# Patient Record
Sex: Female | Born: 1975 | Hispanic: Yes | Marital: Single | Smoking: Current every day smoker
Health system: Southern US, Community
[De-identification: ages and names within clinical notes are randomized; demographics above are authoritative.]

## PROBLEM LIST (undated history)

## (undated) DIAGNOSIS — I10 Essential (primary) hypertension: Secondary | ICD-10-CM

## (undated) HISTORY — PX: HERNIA REPAIR: SHX51

## (undated) HISTORY — PX: OTHER SURGICAL HISTORY: SHX169

## (undated) HISTORY — PX: CHOLECYSTECTOMY: SHX55

## (undated) HISTORY — PX: TUBAL LIGATION: SHX77

---

## 2014-10-09 ENCOUNTER — Emergency Department (HOSPITAL_COMMUNITY): Payer: Self-pay

## 2014-10-09 ENCOUNTER — Encounter (HOSPITAL_COMMUNITY): Payer: Self-pay | Admitting: Emergency Medicine

## 2014-10-09 ENCOUNTER — Emergency Department (HOSPITAL_COMMUNITY)
Admission: EM | Admit: 2014-10-09 | Discharge: 2014-10-09 | Disposition: A | Discharge status: Home / Self Care | Payer: Self-pay | Attending: Emergency Medicine | Admitting: Emergency Medicine

## 2014-10-09 DIAGNOSIS — S42292A Other displaced fracture of upper end of left humerus, initial encounter for closed fracture: Secondary | ICD-10-CM

## 2014-10-09 DIAGNOSIS — Z720 Tobacco use: Secondary | ICD-10-CM | POA: Insufficient documentation

## 2014-10-09 DIAGNOSIS — W19XXXA Unspecified fall, initial encounter: Secondary | ICD-10-CM

## 2014-10-09 DIAGNOSIS — I10 Essential (primary) hypertension: Secondary | ICD-10-CM | POA: Insufficient documentation

## 2014-10-09 DIAGNOSIS — S42255D Nondisplaced fracture of greater tuberosity of left humerus, subsequent encounter for fracture with routine healing: Secondary | ICD-10-CM | POA: Insufficient documentation

## 2014-10-09 DIAGNOSIS — W1839XD Other fall on same level, subsequent encounter: Secondary | ICD-10-CM | POA: Insufficient documentation

## 2014-10-09 HISTORY — DX: Essential (primary) hypertension: I10

## 2014-10-09 MED ORDER — HYDROCODONE-ACETAMINOPHEN 5-325 MG PO TABS
2.0000 | ORAL_TABLET | Freq: Once | ORAL | Status: AC
  Administered 2014-10-09: 2 via ORAL
  Filled 2014-10-09: qty 2

## 2014-10-09 MED ORDER — DICLOFENAC SODIUM 75 MG PO TBEC: 75.0000 mg | DELAYED_RELEASE_TABLET | Freq: Two times a day (BID) | ORAL | Status: AC

## 2014-10-09 MED ORDER — HYDROCODONE-ACETAMINOPHEN 5-325 MG PO TABS: 1.0000 | ORAL_TABLET | ORAL | Status: DC | PRN

## 2014-10-09 MED ORDER — METHOCARBAMOL 500 MG PO TABS
1000.0000 mg | ORAL_TABLET | Freq: Once | ORAL | Status: AC
  Administered 2014-10-09: 1000 mg via ORAL
  Filled 2014-10-09: qty 2

## 2014-10-09 MED ORDER — KETOROLAC TROMETHAMINE 10 MG PO TABS
10.0000 mg | ORAL_TABLET | Freq: Once | ORAL | Status: AC
  Administered 2014-10-09: 10 mg via ORAL
  Filled 2014-10-09: qty 1

## 2014-10-09 NOTE — ED Provider Notes (Signed)
CSN: [id number]     Arrival date & time 10/09/14  1302 History   None    Chief Complaint  Patient presents with  . Shoulder Pain     (Consider location/radiation/quality/duration/timing/severity/associated sxs/prior Treatment) Patient is a 38 y.o. female presenting with shoulder injury. The history is provided by the patient.  Shoulder Injury This is a new problem. The current episode started 1 to 4 weeks ago. The problem occurs intermittently. The problem has been rapidly worsening. Associated symptoms include arthralgias. Pertinent negatives include no abdominal pain, chest pain, coughing, fever, neck pain, numbness or weakness. Exacerbated by: Pain with movement of he left shoulder. She has tried heat, ice and NSAIDs for the symptoms. The treatment provided significant relief.    Past Medical History  Diagnosis Date  . Hypertension    Past Surgical History  Procedure Laterality Date  . Right eye    . Cholecystectomy    . Hernia repair    . Tubal ligation     No family history on file. History  Substance Use Topics  . Smoking status: Current Every Day Smoker -- 1.00 packs/day    Types: Cigarettes  . Smokeless tobacco: Not on file  . Alcohol Use: No   OB History    No data available     Review of Systems  Constitutional: Negative for fever and activity change.       All ROS Neg except as noted in HPI  HENT: Negative for nosebleeds.   Eyes: Negative for photophobia and discharge.  Respiratory: Negative for cough, shortness of breath and wheezing.   Cardiovascular: Negative for chest pain and palpitations.  Gastrointestinal: Negative for abdominal pain and blood in stool.  Genitourinary: Negative for dysuria, frequency and hematuria.  Musculoskeletal: Positive for arthralgias. Negative for back pain and neck pain.  Skin: Negative.   Neurological: Negative for dizziness, seizures, speech difficulty, weakness and numbness.  Psychiatric/Behavioral: Negative for  hallucinations and confusion.      Allergies  Codeine  Home Medications   Prior to Admission medications   Not on File   BP 161/115 mmHg  Pulse 98  Temp(Src) 97.8 F (36.6 C) (Oral)  Resp 18  Ht 5' (1.524 m)  Wt 142 lb (64.411 kg)  BMI 27.73 kg/m2  SpO2 99%  LMP 10/02/2014 Physical Exam  Constitutional: She is oriented to person, place, and time. She appears well-developed and well-nourished.  Non-toxic appearance.  HENT:  Head: Normocephalic.  Right Ear: Tympanic membrane and external ear normal.  Left Ear: Tympanic membrane and external ear normal.  Eyes: EOM and lids are normal. Pupils are equal, round, and reactive to light.  Neck: Normal range of motion. Neck supple. Carotid bruit is not present.  Cardiovascular: Normal rate, regular rhythm, normal heart sounds, intact distal pulses and normal pulses.   Pulmonary/Chest: Breath sounds normal. No respiratory distress.  Abdominal: Soft. Bowel sounds are normal. There is no tenderness. There is no guarding.  Musculoskeletal:       Left shoulder: She exhibits decreased range of motion, tenderness, swelling and pain.  Lymphadenopathy:       Head (right side): No submandibular adenopathy present.       Head (left side): No submandibular adenopathy present.    She has no cervical adenopathy.  Neurological: She is alert and oriented to person, place, and time. She has normal strength. No cranial nerve deficit or sensory deficit.  Skin: Skin is warm and dry.  Psychiatric: She has a normal mood and affect.  Her speech is normal.  Nursing note and vitals reviewed.   ED Course  Procedures  FRACTURE CARE LEFT SHOULDER. Patient identified by arm band. Permission for procedure given by the patient. Fracture of the left humeral head discussed with the patient in terms which he understands. Patient placed in a shoulder immobilizer. Capillary refill remains less than 2 seconds, pulses 2+, and color as well as temperature stable  after immobilizer placed. Prescription for Decadron and Norco given to the patient. Patient tolerated the procedure without problem.   Labs Review Labs Reviewed - No data to display  Imaging Review Dg Shoulder Left  10/09/2014   CLINICAL DATA:  Left shoulder pain post fall in the bathtub 2 weeks ago  EXAM: LEFT SHOULDER - 2+ VIEW  COMPARISON:  None.  FINDINGS: Three views of the left shoulder submitted. There is nondisplaced healing fracture of greater tuberosity humeral head. Glenohumeral joint is preserved. AC joint is unremarkable.  IMPRESSION: Nondisplaced healing fracture of humeral head greater tuberosity.   Electronically Signed   By: Natasha MeadLiviu  Pop M.D.   On: 10/09/2014 13:57     EKG Interpretation None      MDM  X-ray of the left shoulder reveals a nondisplaced healing fracture of the humeral head at the greater tuberosity. The compartments are soft. There is no vascular compromise appreciated. The patient is fitted with a shoulder immobilizer. Prescriptionfor Norco, diclofenac, and Robaxin given to the patient. The patient is referred to Dr. Hilda LiasKeeling for orthopedic evaluation and management. Patient acknowledges understanding of the findings of the x-ray and the discharge plan.   Final diagnoses:  Humerus head fracture, left, closed, initial encounter    *I have reviewed nursing notes, vital signs, and all appropriate lab and imaging results for this Patient.  Kathie DikeHobson M Rhoderick Farrel, PA-C 10/09/14 1633  Kathie DikeHobson M [place] Droll, PA-C 10/10/14 2022  Linwood DibblesJon Knapp, MD 10/12/14 [phone]

## 2014-10-09 NOTE — Discharge Instructions (Signed)
Please use the shoulder immobilizer until seen by the orthopedic MD . You have a fracture of the shoulder (humeral head) on the left.  Please use diclofenac 2 times daily. Use Norco for more severe pain if needed. This medication may cause drowsiness, use with caution. Humerus Fracture, Treated with Immobilization The humerus is the large bone in your upper arm. You have a broken (fractured) humerus. These fractures are easily diagnosed with X-rays. TREATMENT  Simple fractures which will heal without disability are treated with simple immobilization. Immobilization means you will wear a cast, splint, or sling. You have a fracture which will do well with immobilization. The fracture will heal well simply by being held in a good position until it is stable enough to begin range of motion exercises. Do not take part in activities which would further injure your arm.  HOME CARE INSTRUCTIONS   Put ice on the injured area.  Put ice in a plastic bag.  Place a towel between your skin and the bag.  Leave the ice on for 15-20 minutes, 03-04 times a day.  If you have a cast:  Do not scratch the skin under the cast using sharp or pointed objects.  Check the skin around the cast every day. You may put lotion on any red or sore areas.  Keep your cast dry and clean.  If you have a splint:  Wear the splint as directed.  Keep your splint dry and clean.  You may loosen the elastic around the splint if your fingers become numb, tingle, or turn cold or blue.  If you have a sling:  Wear the sling as directed.  Do not put pressure on any part of your cast or splint until it is fully hardened.  Your cast or splint can be protected during bathing with a plastic bag. Do not lower the cast or splint into water.  Only take over-the-counter or prescription medicines for pain, discomfort, or fever as directed by your caregiver.  Do range of motion exercises as instructed by your caregiver.  Follow up  as directed by your caregiver. This is very important in order to avoid permanent injury or disability and chronic pain. SEEK IMMEDIATE MEDICAL CARE IF:   Your skin or nails in the injured arm turn blue or gray.  Your arm feels cold or numb.  You develop severe pain in the injured arm.  You are having problems with the medicines you were given. MAKE SURE YOU:   Understand these instructions.  Will watch your condition.  Will get help right away if you are not doing well or get worse. Document Released: 02/15/2001 Document Revised: 02/01/2012 Document Reviewed: 12/24/2010 [facility] Patient Information 2015 [place], [place]. This information is not intended to replace advice given to you by your health care provider. Make sure you discuss any questions you have with your health care provider.

## 2014-10-09 NOTE — ED Notes (Signed)
PT reports left shoulder pain from fall x2 weeks ago with pain on ROM.

## 2014-10-24 ENCOUNTER — Emergency Department (HOSPITAL_COMMUNITY): Payer: Self-pay

## 2014-10-24 ENCOUNTER — Emergency Department (HOSPITAL_COMMUNITY)
Admission: EM | Admit: 2014-10-24 | Discharge: 2014-10-24 | Disposition: A | Discharge status: Home / Self Care | Payer: Self-pay | Attending: Emergency Medicine | Admitting: Emergency Medicine

## 2014-10-24 ENCOUNTER — Encounter (HOSPITAL_COMMUNITY): Payer: Self-pay

## 2014-10-24 DIAGNOSIS — Z720 Tobacco use: Secondary | ICD-10-CM | POA: Insufficient documentation

## 2014-10-24 DIAGNOSIS — Z791 Long term (current) use of non-steroidal anti-inflammatories (NSAID): Secondary | ICD-10-CM | POA: Insufficient documentation

## 2014-10-24 DIAGNOSIS — M79602 Pain in left arm: Secondary | ICD-10-CM | POA: Insufficient documentation

## 2014-10-24 DIAGNOSIS — I10 Essential (primary) hypertension: Secondary | ICD-10-CM | POA: Insufficient documentation

## 2014-10-24 MED ORDER — HYDROCODONE-ACETAMINOPHEN 5-325 MG PO TABS: 1.0000 | ORAL_TABLET | Freq: Four times a day (QID) | ORAL | Status: AC | PRN

## 2014-10-24 NOTE — Discharge Instructions (Signed)
As discussed, it is very important that you obtain assistance with orthopedists to ensure appropriate healing of your previously broken arm. If you are unable to follow-up with our local orthopedist, please use the information [facility]  In addition, you may consider following up with health department, or at this facility during daylight hours to discuss assistance from case management and/or social work.   Cryotherapy Cryotherapy means treatment with cold. Ice or gel packs can be used to reduce both pain and swelling. Ice is the most helpful within the first 24 to 48 hours after an injury or flare-up from overusing a muscle or joint. Sprains, strains, spasms, burning pain, shooting pain, and aches can all be eased with ice. Ice can also be used when recovering from surgery. Ice is effective, has very few side effects, and is safe for most people to use. PRECAUTIONS  Ice is not a safe treatment option for people with:  Raynaud phenomenon. This is a condition affecting small blood vessels in the extremities. Exposure to cold may cause your problems to return.  Cold hypersensitivity. There are many forms of cold hypersensitivity, including:  Cold urticaria. Red, itchy hives appear on the skin when the tissues begin to warm after being iced.  Cold erythema. This is a red, itchy rash caused by exposure to cold.  Cold hemoglobinuria. Red blood cells break down when the tissues begin to warm after being iced. The hemoglobin that carry oxygen are passed into the urine because they cannot combine with blood proteins fast enough.  Numbness or altered sensitivity in the area being iced. If you have any of the following conditions, do not use ice until you have discussed cryotherapy with your caregiver:  Heart conditions, such as arrhythmia, angina, or chronic heart disease.  High blood pressure.  Healing wounds or open skin in the area being iced.  Current  infections.  Rheumatoid arthritis.  Poor circulation.  Diabetes. Ice slows the blood flow in the region it is applied. This is beneficial when trying to stop inflamed tissues from spreading irritating chemicals to surrounding tissues. However, if you expose your skin to cold temperatures for too long or without the proper protection, you can damage your skin or nerves. Watch for signs of skin damage due to cold. HOME CARE INSTRUCTIONS Follow these tips to use ice and cold packs safely.  Place a dry or damp towel between the ice and skin. A damp towel will cool the skin more quickly, so you may need to shorten the time that the ice is used.  For a more rapid response, add gentle compression to the ice.  Ice for no more than 10 to 20 minutes at a time. The bonier the area you are icing, the less time it will take to get the benefits of ice.  Check your skin after 5 minutes to make sure there are no signs of a poor response to cold or skin damage.  Rest 20 minutes or more between uses.  Once your skin is numb, you can end your treatment. You can test numbness by very lightly touching your skin. The touch should be so light that you do not see the skin dimple from the pressure of your fingertip. When using ice, most people will feel these normal sensations in this order: cold, burning, aching, and numbness.  Do not use ice on someone who cannot communicate their responses to pain, such as small children or people with dementia. HOW TO MAKE AN  ICE PACK Ice packs are the most common way to use ice therapy. Other methods include ice massage, ice baths, and cryosprays. Muscle creams that cause a cold, tingly feeling do not offer the same benefits that ice offers and should not be used as a substitute unless recommended by your caregiver. To make an ice pack, do one of the following:  Place crushed ice or a bag of frozen vegetables in a sealable plastic bag. Squeeze out the excess air. Place this  bag inside another plastic bag. Slide the bag into a pillowcase or place a damp towel between your skin and the bag.  Mix 3 parts water with 1 part rubbing alcohol. Freeze the mixture in a sealable plastic bag. When you remove the mixture from the freezer, it will be slushy. Squeeze out the excess air. Place this bag inside another plastic bag. Slide the bag into a pillowcase or place a damp towel between your skin and the bag. SEEK MEDICAL CARE IF:  You develop white spots on your skin. This may give the skin a blotchy (mottled) appearance.  Your skin turns blue or pale.  Your skin becomes waxy or hard.  Your swelling gets worse. MAKE SURE YOU:   Understand these instructions.  Will watch your condition.  Will get help right away if you are not doing well or get worse. Document Released: 07/06/2011 Document Revised: 03/26/2014 Document Reviewed: 07/06/2011 [facility] Patient Information 2015 [place], [place]. This information is not intended to replace advice given to you by your health care provider. Make sure you discuss any questions you have with your health care provider.

## 2014-10-24 NOTE — ED Notes (Signed)
Slipped and fell 1 month ago and had a displaced fracture in my left shoulder. I came here and they placed me in a sling per pt. I do not have insurance, so I have not been able to follow up with an orthopedic.

## 2014-10-24 NOTE — ED Notes (Signed)
EDP at bedside  

## 2014-10-24 NOTE — ED Provider Notes (Signed)
CSN: [id number]     Arrival date & time 10/24/14  1848 History  This chart was scribed for Gerhard Munchobert Clester Chlebowski, MD by Gwenyth Oberatherine Macek, ED Scribe. This patient was seen in room APA08/[facility]08 and the patient's care was started at 7:08 PM.    Chief Complaint  Patient presents with  . Arm Pain   The history is provided by the patient. No language interpreter was used.   HPI Comments: Ann Bennett is a 38 y.o. female with a history of HTN who presents to the Emergency Department complaining of persistent, severe left arm pain that started 1 month ago after she fell and occurs daily. She notes decreased ROM of left shoulder and left elbow because of pain. Pt fell while she was cleaning her shower and was diagnosed with displaced fracture. She notes she cannot follow-up because she does not have insurance and does not know how to seek orthopedist. Pt wears a sling 2-3 hours a day, but states that pain becomes worse when she wears it. Pt was seen in the ED two weeks ago for similar symptoms. She denies numbness and tingling as associated symptoms.  Past Medical History  Diagnosis Date  . Hypertension    Past Surgical History  Procedure Laterality Date  . Right eye    . Cholecystectomy    . Hernia repair    . Tubal ligation     No family history on file. History  Substance Use Topics  . Smoking status: Current Every Day Smoker -- 1.00 packs/day    Types: Cigarettes  . Smokeless tobacco: Not on file  . Alcohol Use: No   OB History    No data available     Review of Systems  Constitutional:       Per HPI, otherwise negative  HENT:       Per HPI, otherwise negative  Respiratory:       Per HPI, otherwise negative  Cardiovascular:       Per HPI, otherwise negative  Gastrointestinal: Negative for vomiting.  Endocrine:       Negative aside from HPI  Genitourinary:       Neg aside from HPI   Musculoskeletal: Positive for arthralgias.       Per HPI, otherwise negative  Skin: Negative.   Negative for color change and wound.  Neurological: Negative for syncope and numbness.    Allergies  Codeine  Home Medications   Prior to Admission medications   Medication Sig Start Date End Date Taking? Authorizing Provider  diclofenac (VOLTAREN) 75 MG EC tablet Take 1 tablet (75 mg total) by mouth 2 (two) times daily. 10/09/14   Kathie DikeHobson M Bryant, PA-C  HYDROcodone-acetaminophen (NORCO/VICODIN) 5-325 MG per tablet Take 1 tablet by mouth every 4 (four) hours as needed. 10/09/14   Kathie DikeHobson M Bryant, PA-C   BP 152/110 mmHg  Pulse 95  Temp(Src) 97.7 F (36.5 C) (Oral)  Resp 20  Ht 5' (1.524 m)  Wt 140 lb (63.504 kg)  BMI 27.34 kg/m2  SpO2 100%  LMP 10/21/2014 (Approximate) Physical Exam  Constitutional: She is oriented to person, place, and time. She appears well-developed and well-nourished. No distress.  HENT:  Head: Normocephalic and atraumatic.  Eyes: Conjunctivae and EOM are normal.  Cardiovascular: Normal rate and regular rhythm.   Pulmonary/Chest: Effort normal and breath sounds normal. No stridor. No respiratory distress.  Abdominal: She exhibits no distension.  Musculoskeletal: She exhibits no edema.  Neurological: She is alert and oriented to person, place, and  time. No cranial nerve deficit.  Left arm neurovascularly intact  Skin: Skin is warm and dry.  Psychiatric: She has a normal mood and affect.  Nursing note and vitals reviewed.   ED Course  Procedures   COORDINATION OF CARE: 7:12 PM Discussed treatment plan with pt which includes shoulder x-ray. Pt agreed to plan.    Labs Review Labs Reviewed - No data to display  Imaging Review Dg Shoulder Left  10/24/2014   CLINICAL DATA:  Continued left shoulder pain following left humeral fracture 1 month ago. Initial encounter.  EXAM: LEFT SHOULDER - 2+ VIEW  COMPARISON:  10/09/2014 radiographs  FINDINGS: A nondisplaced humeral head fracture is again identified with evidence of interval healing since the prior  study.  No acute fracture, subluxation or dislocation noted.  The visualized hemithorax is unremarkable.  IMPRESSION: Interval healing changes of nondisplaced left humeral head fracture without other significant change. No evidence of acute bony abnormality.   Electronically Signed   By: Laveda AbbeJeff  Hu M.D.   On: 10/24/2014 19:59  I reviewed the XR, agree with the interpretation.   MDM   Patient presents several weeks after a fall with ongoing pain in the left shoulder. Patient has not followed up with an orthopedic physician yet. Patient has no distal neurovascular compromise, no other complaints, and although she has shoulder pain, she has no acute pathology that is new. We had a lengthy conversation about obtaining assistance with following up, including seeing the health department, going to primary care, and trying other orthopedist's office this Patient was provided additional resources, analgesics, discharged in stable condition.  I personally performed the services described in this documentation, which was scribed in my presence. The recorded information has been reviewed and is accurate.       Gerhard Munchobert Jermeka Schlotterbeck, MD 10/24/14 2015

## 2014-12-19 IMAGING — CR DG SHOULDER 2+V*L*
3 series · 3 of 3 positions shown · non-contrast
Comparison: 10/09/2014 radiographs

CLINICAL DATA: Continued left shoulder pain following left humeral
fracture 1 month ago. Initial encounter.

EXAM:
LEFT SHOULDER - 2+ VIEW

[view not recorded (1 of 3)]
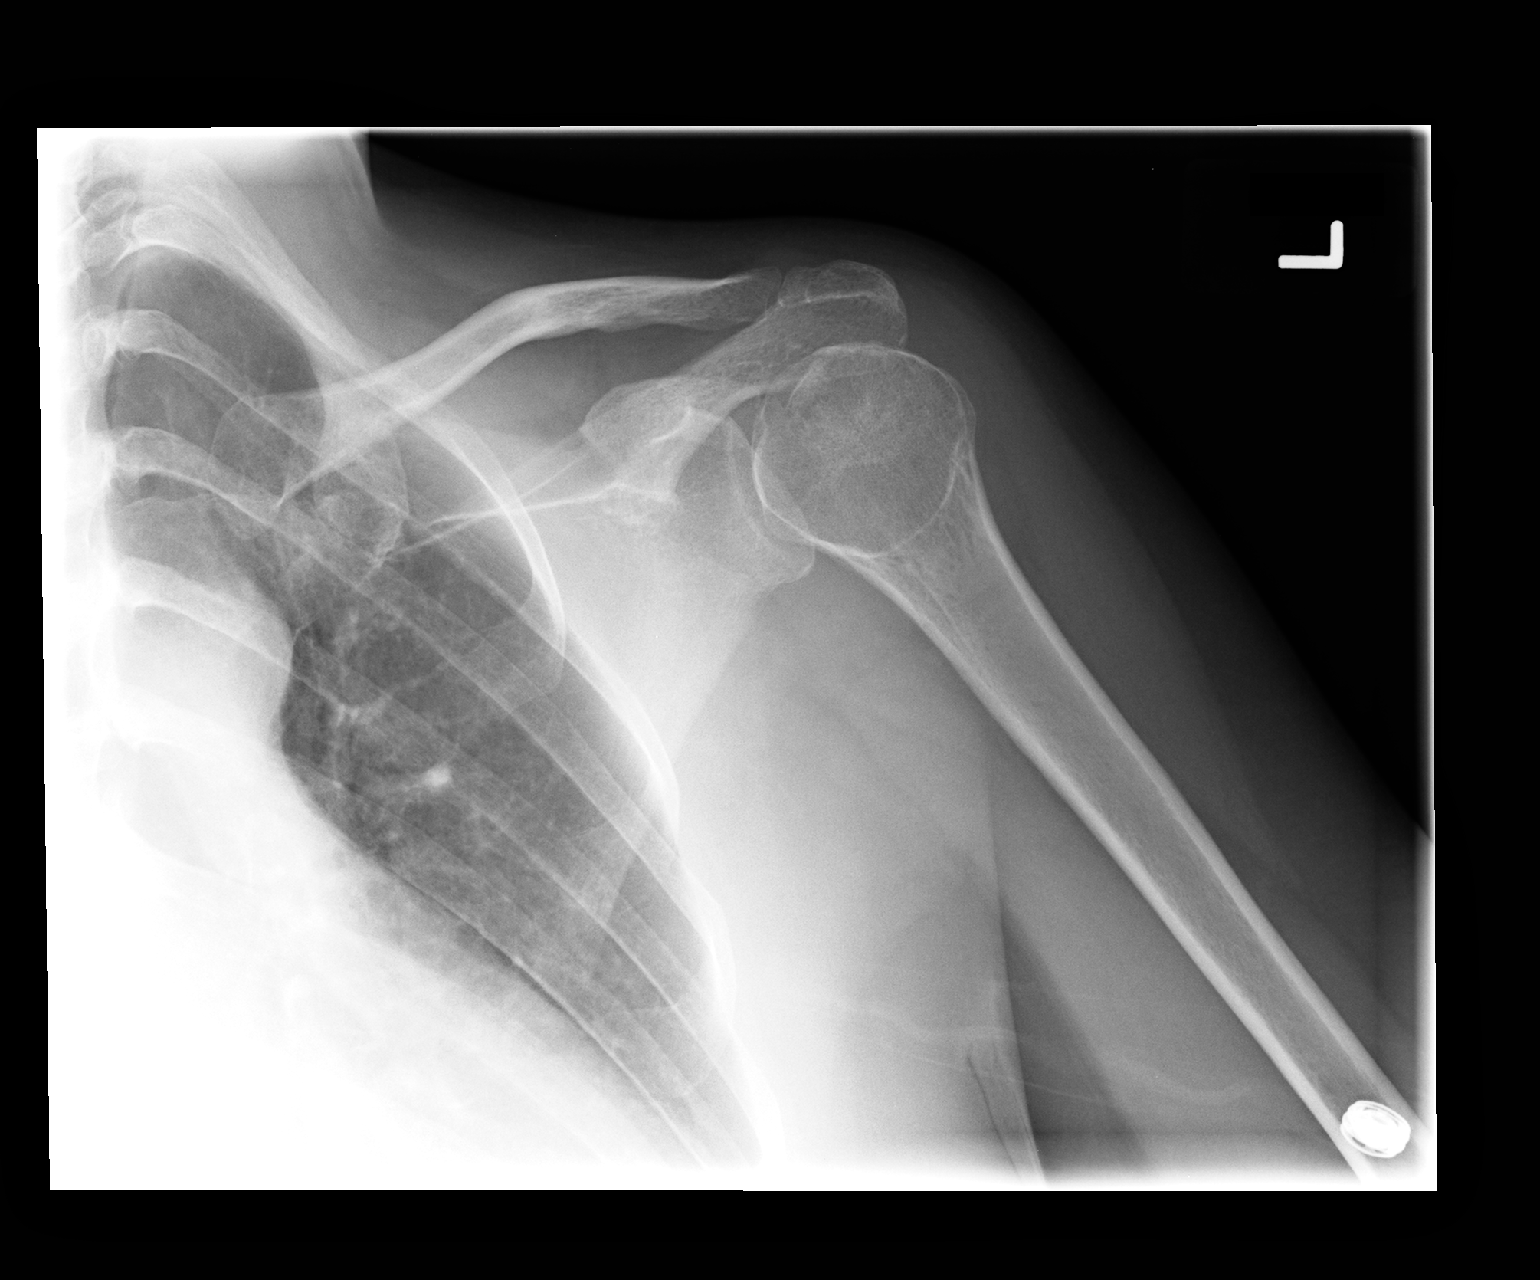

[view not recorded (2 of 3)]
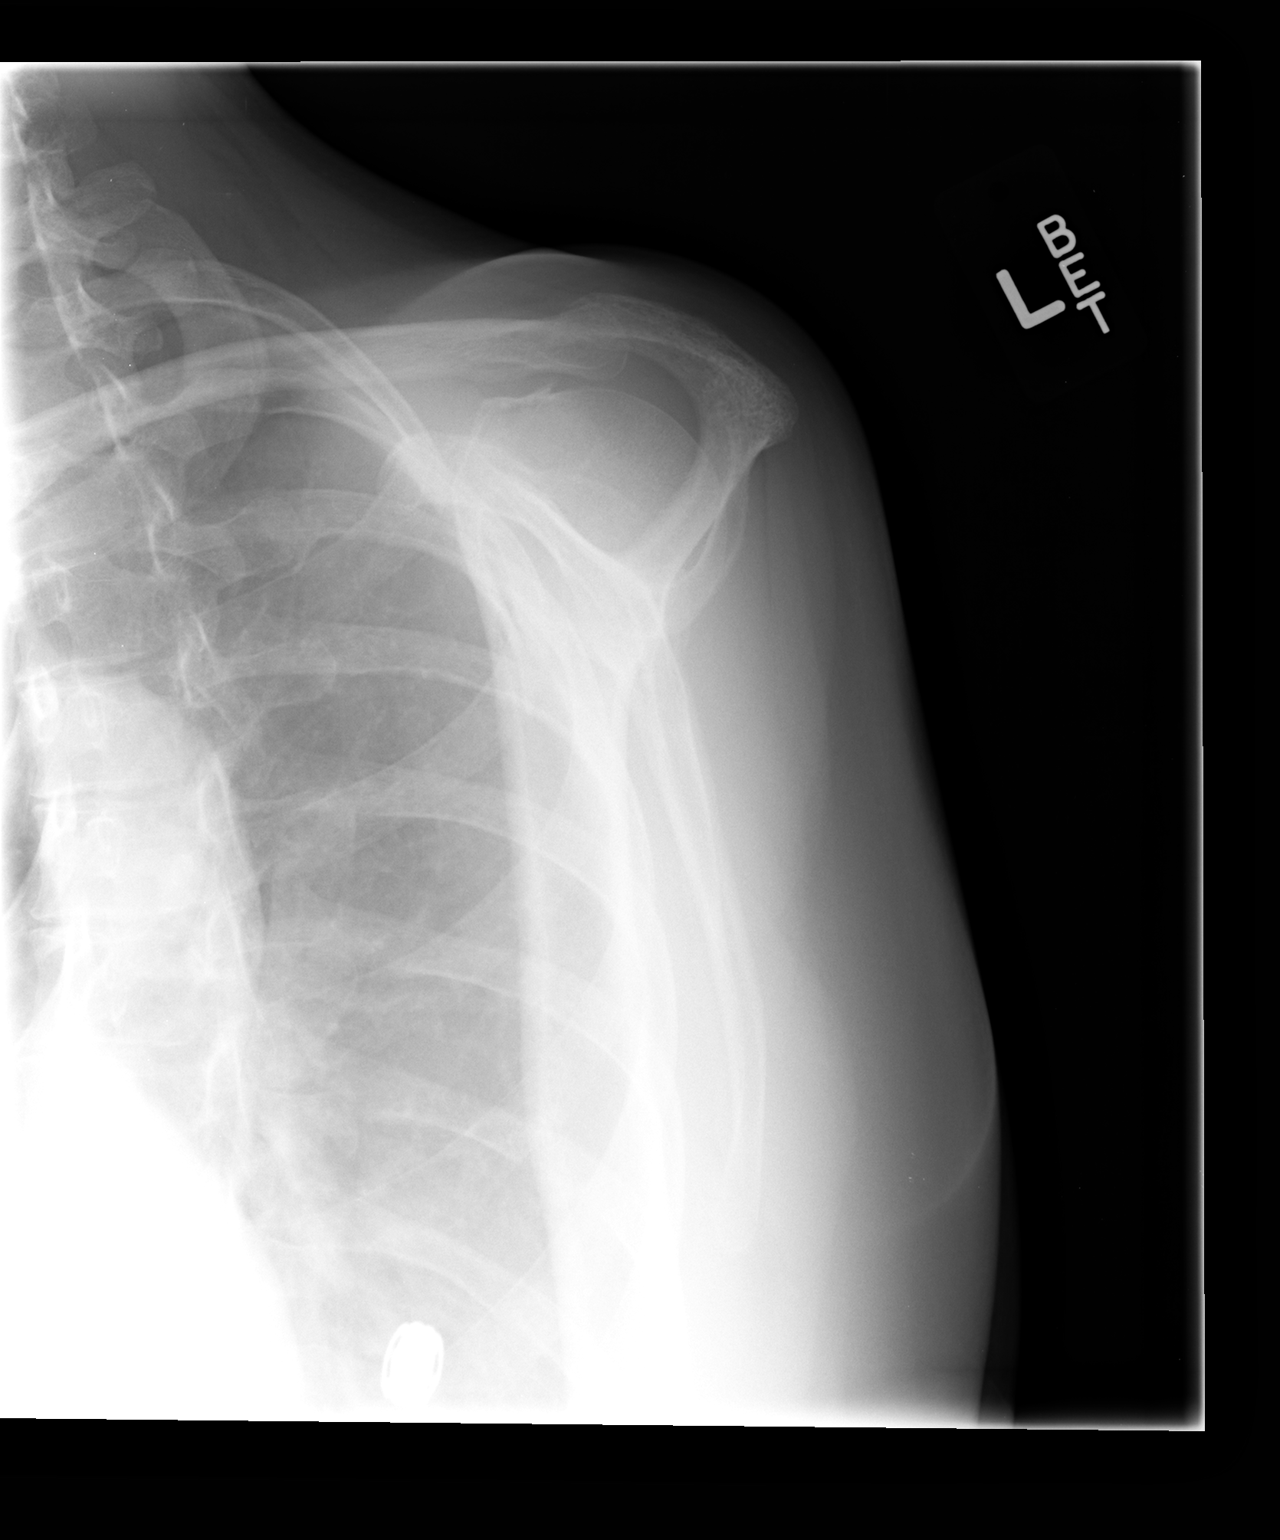

[view not recorded (3 of 3)]
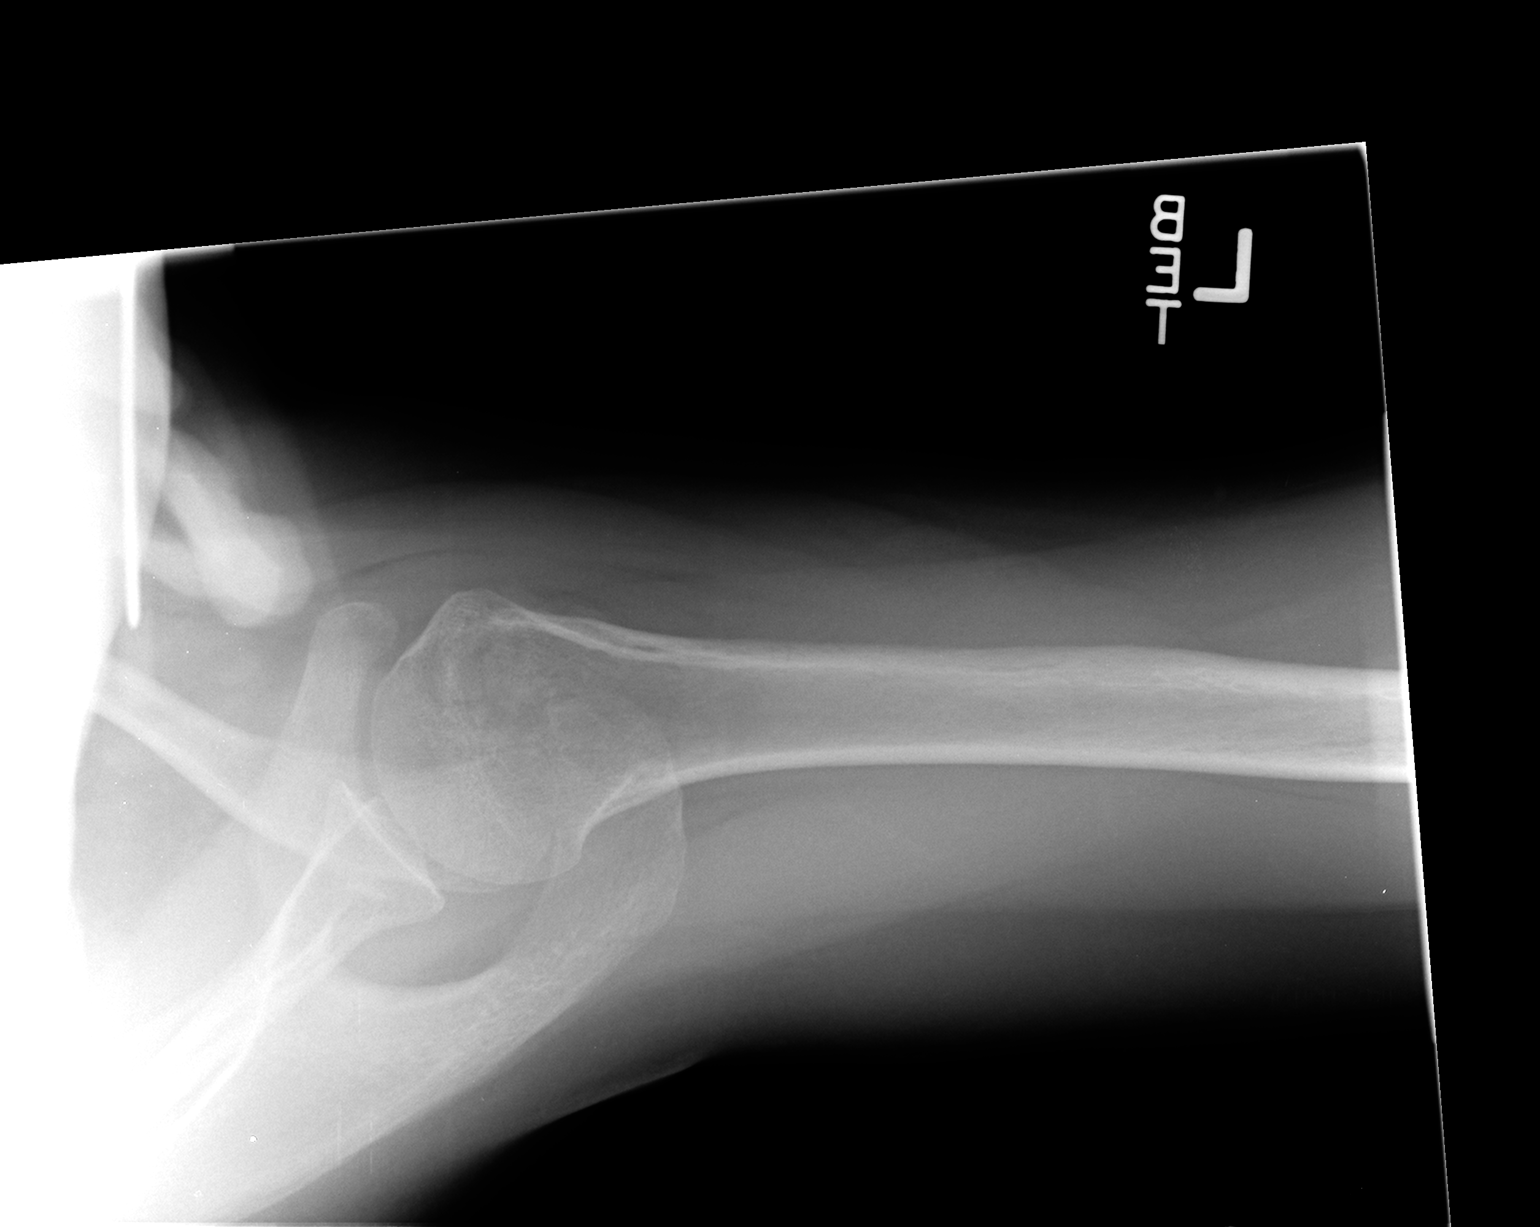

[3 of 3 positions shown; findings below may reference images not displayed]

FINDINGS: A nondisplaced humeral head fracture is again identified with
evidence of interval healing since the prior study.

No acute fracture, subluxation or dislocation noted.

The visualized hemithorax is unremarkable.
IMPRESSION: Interval healing changes of nondisplaced left humeral head fracture
without other significant change. No evidence of acute bony
abnormality.

## 2015-05-24 DEATH — deceased
